# Patient Record
Sex: Female | Born: 1970 | Race: White | Hispanic: Yes | Marital: Married
Health system: Southern US, Community
[De-identification: ages and names within clinical notes are randomized; demographics above are authoritative.]

## PROBLEM LIST (undated history)

## (undated) DIAGNOSIS — Z91018 Allergy to other foods: Secondary | ICD-10-CM

## (undated) DIAGNOSIS — J45909 Unspecified asthma, uncomplicated: Secondary | ICD-10-CM

## (undated) DIAGNOSIS — I519 Heart disease, unspecified: Secondary | ICD-10-CM

---

## 1999-05-12 ENCOUNTER — Ambulatory Visit (HOSPITAL_COMMUNITY): Admission: RE | Admit: 1999-05-12 | Discharge: 1999-05-12 | Payer: Self-pay | Admitting: Cardiology

## 1999-09-20 ENCOUNTER — Other Ambulatory Visit: Admission: RE | Admit: 1999-09-20 | Discharge: 1999-09-20 | Payer: Self-pay | Admitting: Obstetrics and Gynecology

## 2000-12-11 ENCOUNTER — Other Ambulatory Visit: Admission: RE | Admit: 2000-12-11 | Discharge: 2000-12-11 | Payer: Self-pay | Admitting: Obstetrics and Gynecology

## 2001-01-27 ENCOUNTER — Emergency Department (HOSPITAL_COMMUNITY): Admission: EM | Admit: 2001-01-27 | Discharge: 2001-01-27 | Payer: Self-pay

## 2001-01-29 ENCOUNTER — Other Ambulatory Visit: Admission: RE | Admit: 2001-01-29 | Discharge: 2001-01-29 | Payer: Self-pay | Admitting: Orthopedic Surgery

## 2002-02-11 ENCOUNTER — Emergency Department (HOSPITAL_COMMUNITY): Admission: EM | Admit: 2002-02-11 | Discharge: 2002-02-11 | Payer: Self-pay | Admitting: Emergency Medicine

## 2003-04-16 ENCOUNTER — Inpatient Hospital Stay (HOSPITAL_COMMUNITY): Admission: AD | Admit: 2003-04-16 | Discharge: 2003-04-17 | Payer: Self-pay | Admitting: Obstetrics & Gynecology

## 2003-05-13 ENCOUNTER — Other Ambulatory Visit: Admission: RE | Admit: 2003-05-13 | Discharge: 2003-05-13 | Payer: Self-pay | Admitting: Obstetrics and Gynecology

## 2004-03-19 ENCOUNTER — Ambulatory Visit (HOSPITAL_COMMUNITY): Admission: AD | Admit: 2004-03-19 | Discharge: 2004-03-20 | Payer: Self-pay | Admitting: *Deleted

## 2004-09-22 ENCOUNTER — Encounter: Admission: RE | Admit: 2004-09-22 | Discharge: 2004-09-22 | Payer: Self-pay | Admitting: Cardiology

## 2005-01-23 ENCOUNTER — Other Ambulatory Visit: Admission: RE | Admit: 2005-01-23 | Discharge: 2005-01-23 | Payer: Self-pay | Admitting: Obstetrics and Gynecology

## 2005-06-05 ENCOUNTER — Inpatient Hospital Stay (HOSPITAL_COMMUNITY): Admission: AD | Admit: 2005-06-05 | Discharge: 2005-06-08 | Payer: Self-pay | Admitting: Obstetrics and Gynecology

## 2005-06-09 ENCOUNTER — Inpatient Hospital Stay (HOSPITAL_COMMUNITY): Admission: AD | Admit: 2005-06-09 | Discharge: 2005-06-09 | Payer: Self-pay | Admitting: Obstetrics and Gynecology

## 2005-08-04 ENCOUNTER — Inpatient Hospital Stay (HOSPITAL_COMMUNITY): Admission: AD | Admit: 2005-08-04 | Discharge: 2005-08-04 | Payer: Self-pay | Admitting: Obstetrics & Gynecology

## 2005-08-07 ENCOUNTER — Inpatient Hospital Stay (HOSPITAL_COMMUNITY): Admission: RE | Admit: 2005-08-07 | Discharge: 2005-08-10 | Payer: Self-pay | Admitting: Obstetrics and Gynecology

## 2006-11-10 IMAGING — US US OB COMP +14 WK
1 series · 13 of 28 positions shown · non-contrast
Comparison: none

CLINICAL DATA: Maternal atrial fibrillation.  Assess fetal weight, amniotic fluid volume and biophysical profile.  History of cleft lip.

[Series 1: us ob comp +14 wk · 0.47mm/px · 13 of 33 slices shown]
[im 2/33]
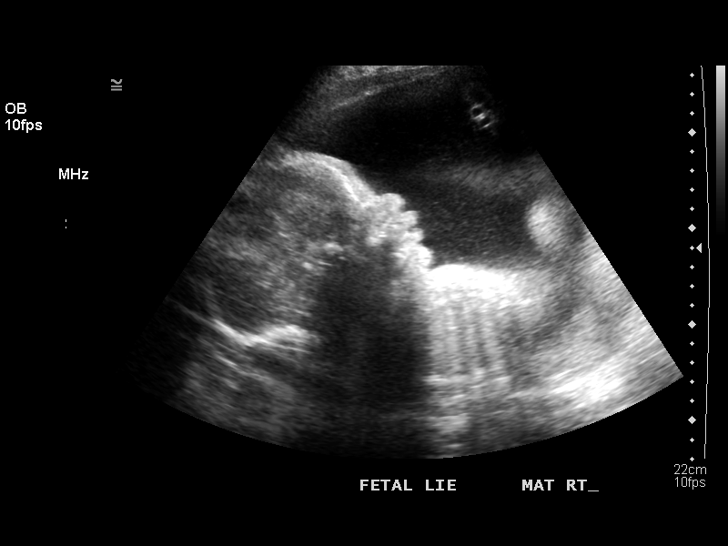
[im 4/33]
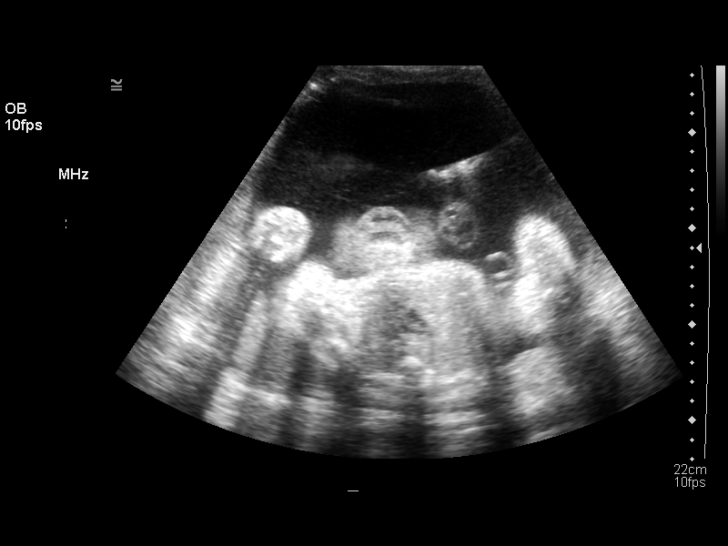
[im 6/33]
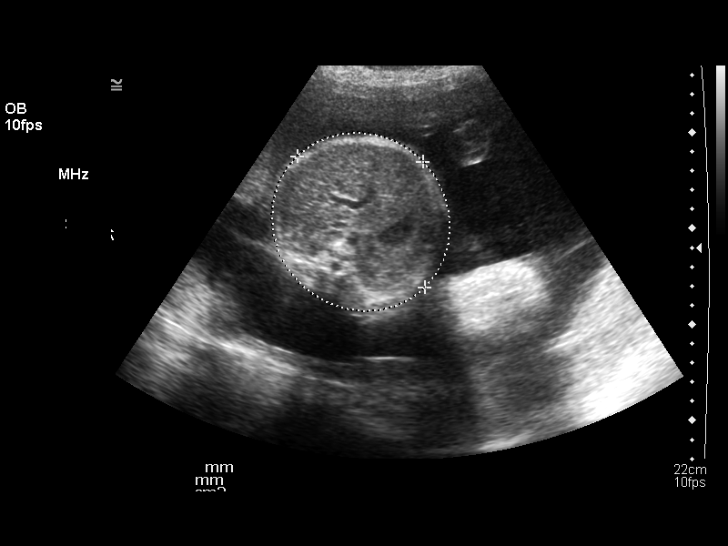
[im 9/33]
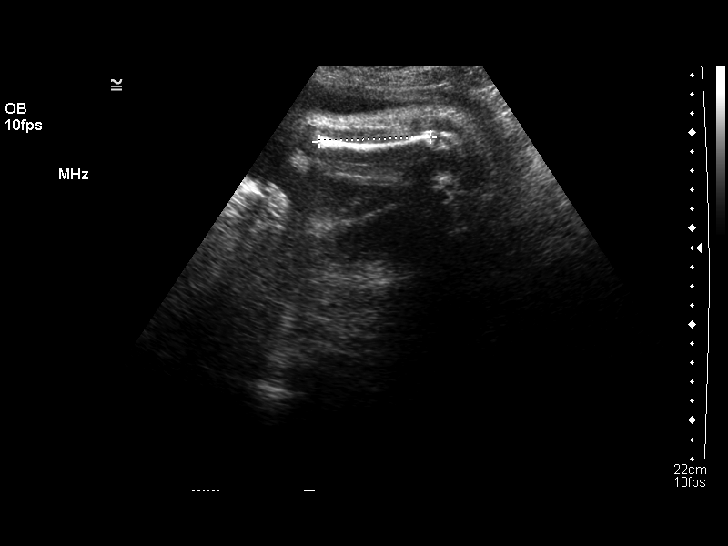
[im 11/33]
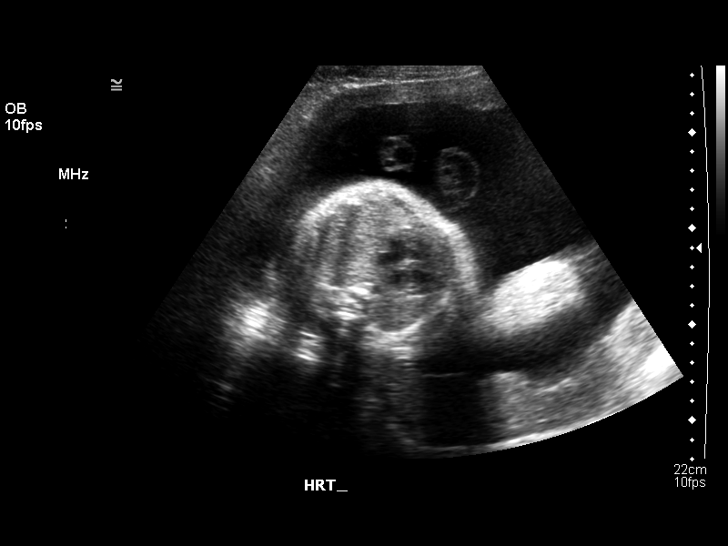
[im 14/33]
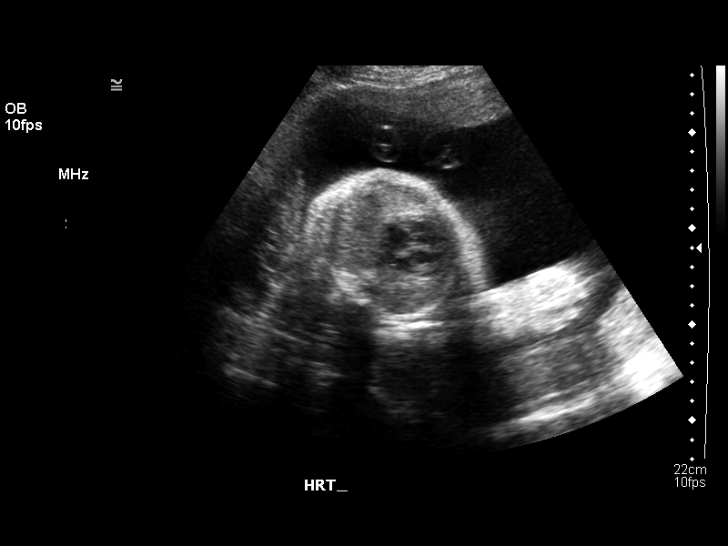
[im 17/33]
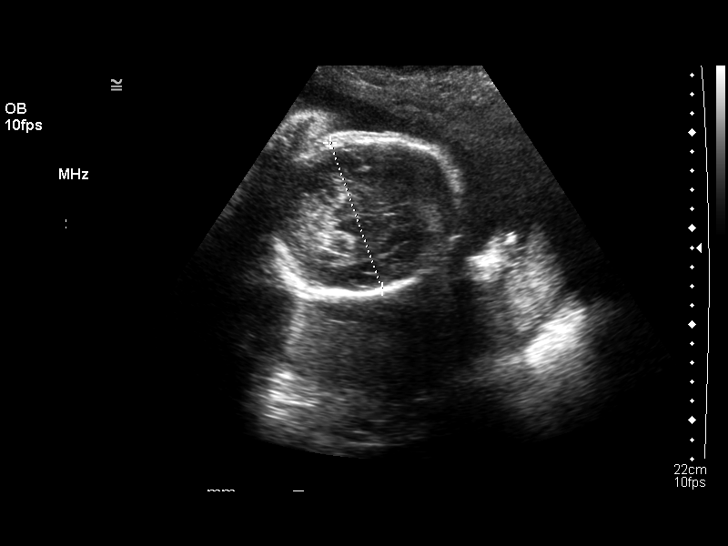
[im 19/33]
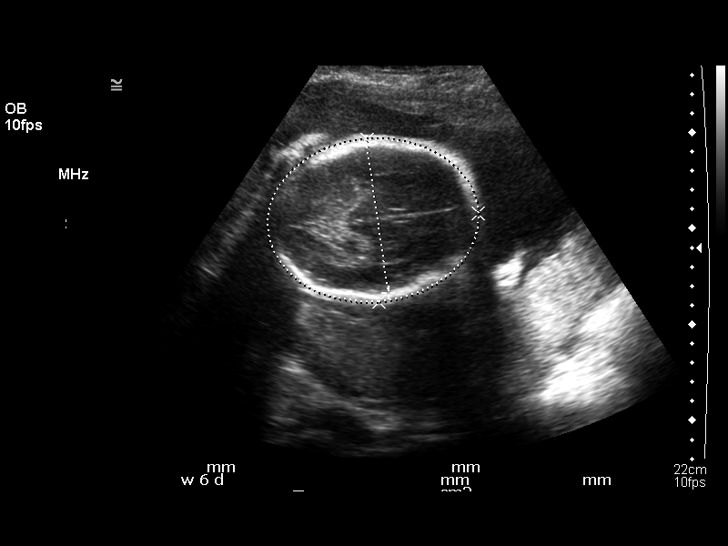
[im 22/33]
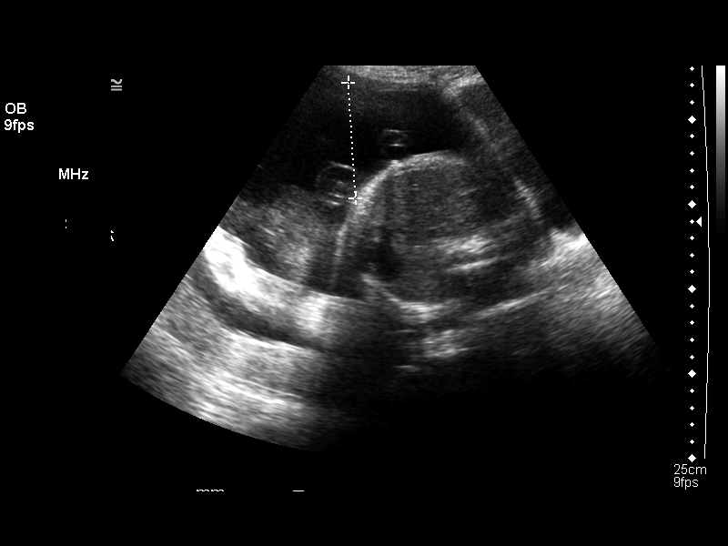
[im 24/33]
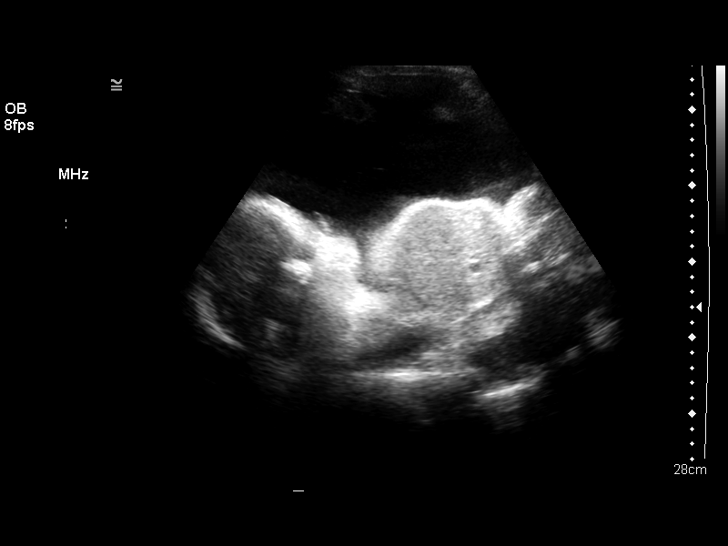
[im 27/33]
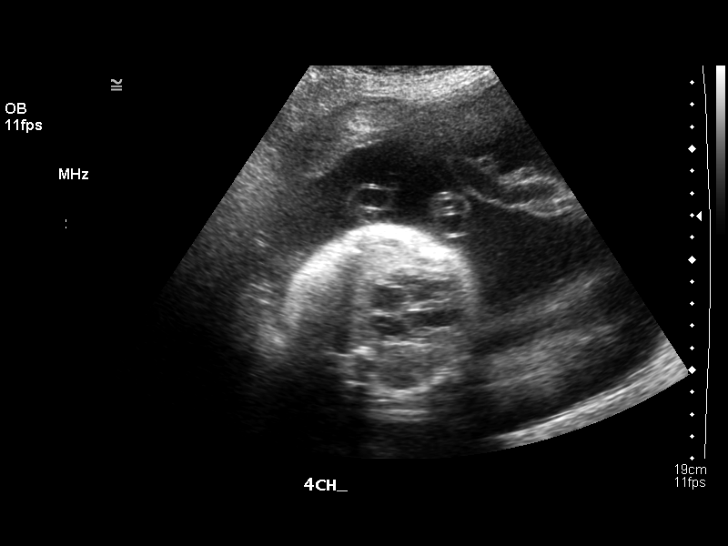
[im 29/33]
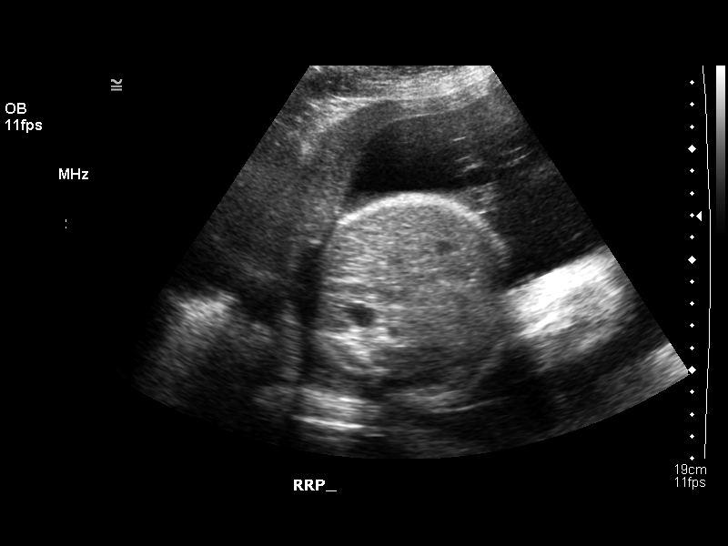
[im 31/33]
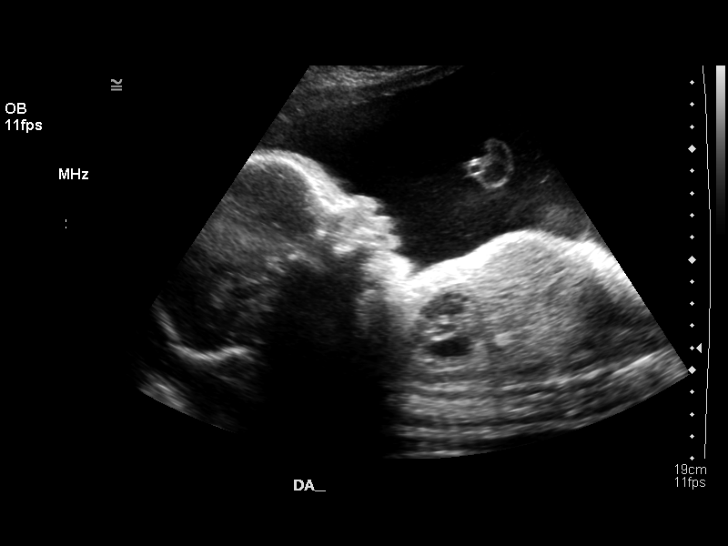

[13 of 28 positions shown; findings below may reference images not displayed]

OBSTETRICAL ULTRASOUND:
Number of Fetuses:  1
Heart Rate:  139
Movement:  Yes
Breathing:  Yes
Presentation:  Transverse
Placental Location:  Posterior
Grade:  I
Previa:  No
Amniotic Fluid (Subjective):  Increased
Amniotic Fluid (Objective):   31.2 cm AFI (5th -95th%ile = 8.8 ? 23.8 cm for 31 wks)

FETAL BIOMETRY
BPD:  8.0 cm   32 w 2 d
HC:  30.2 cm  33 w 4 d
AC:  29.6 cm   33 w 4 d
FL:    5.8 cm  30 w 2 d

MEAN GA:  32 w 3 d
Fetal indices are within normal limits.  
EFW:  6775 g (H) 90th ? 95th%ile (7976 ? 2949 g) For 31 wks

FETAL ANATOMY
Lateral Ventricles:    Visualized 
Thalami/CSP:      Visualized 
Posterior Fossa:  Not visualized 
Nuchal Region:    N/A
Spine:      Not visualized 
4 Chamber Heart on Left:      Visualized 
Stomach on Left:      Visualized 
3 Vessel Cord:    Visualized 
Cord Insertion site:    Visualized 
Kidneys:  Visualized 
Bladder:  Visualized 
Extremities:      Not visualized 

ADDITIONAL ANATOMY VISUALIZED:  Upper lip.
Comments:  Noted is the presence of right renal fetal pyelectasis measuring 7.9 mm in AP width.

MATERNAL UTERINE AND ADNEXAL FINDINGS
Cervix:   4.9 cm transabdominally

BIOPHYSICAL PROFILE

Movement:  2    Time:  20 minutes
Breathing:  2
Tone:  2
Amniotic Fluid:  2

Total Score:  8
IMPRESSION: 1.  Single intrauterine pregnancy demonstrating an estimated gestational age by ultrasound of 32 weeks and 3 days.  This is 1 week and 5 days ahead of assigned gestational age of 30 weeks and 5 days.  Currently the estimated fetal weight is between the 90th and 95th percentile for a 31 week gestation and suggests the presence of a large for gestational age fetus.  
2.  Subjectively and quantitatively increased amniotic fluid volume with an amniotic fluid index of 31.2 cm (8.8 ? 23.8 cm).  
3.  A limited anatomic exam was possible due to advanced gestational age.  No late developing fetal anatomic abnormalities are identified associated with the lateral ventricles, four chamber heart, stomach or bladder.  There is mild renal fetal pyelectasis identified on the right with an AP measurement today of 7.9 mm.  Continued follow-up is recommended.  If this persists past 33 weeks to an extent greater than 7 mm, Ebsta follow-up would be recommended.  etal lip appeared within normal limits.  
4.  Biophysical profile score [DATE].  
5.  Normal cervical length.

## 2014-08-22 ENCOUNTER — Emergency Department (INDEPENDENT_AMBULATORY_CARE_PROVIDER_SITE_OTHER): Payer: BC Managed Care – PPO

## 2014-08-22 ENCOUNTER — Emergency Department
Admission: EM | Admit: 2014-08-22 | Discharge: 2014-08-22 | Disposition: A | Discharge status: Home / Self Care | Payer: Self-pay | Source: Home / Self Care | Attending: Family Medicine | Admitting: Family Medicine

## 2014-08-22 DIAGNOSIS — J181 Lobar pneumonia, unspecified organism: Principal | ICD-10-CM

## 2014-08-22 DIAGNOSIS — J45901 Unspecified asthma with (acute) exacerbation: Secondary | ICD-10-CM

## 2014-08-22 DIAGNOSIS — J189 Pneumonia, unspecified organism: Secondary | ICD-10-CM

## 2014-08-22 DIAGNOSIS — R059 Cough, unspecified: Secondary | ICD-10-CM

## 2014-08-22 DIAGNOSIS — J180 Bronchopneumonia, unspecified organism: Secondary | ICD-10-CM

## 2014-08-22 DIAGNOSIS — R05 Cough: Secondary | ICD-10-CM

## 2014-08-22 HISTORY — DX: Allergy to other foods: Z91.018

## 2014-08-22 HISTORY — DX: Heart disease, unspecified: I51.9

## 2014-08-22 HISTORY — DX: Unspecified asthma, uncomplicated: J45.909

## 2014-08-22 LAB — POCT CBC W AUTO DIFF (K'VILLE URGENT CARE)

## 2014-08-22 MED ORDER — DOXYCYCLINE HYCLATE 100 MG PO CAPS: 100.0000 mg | ORAL_CAPSULE | Freq: Two times a day (BID) | ORAL | Status: AC

## 2014-08-22 MED ORDER — BENZONATATE 200 MG PO CAPS: 200.0000 mg | ORAL_CAPSULE | Freq: Every day | ORAL | Status: AC

## 2014-08-22 NOTE — ED Provider Notes (Signed)
CSN: [id number]     Arrival date & time 08/22/14  1151 History   First MD Initiated Contact with Patient 08/22/14 1439     Chief Complaint  Patient presents with  . Cough      HPI Comments: Patient reports that she was treated for pneumonia six weeks ago with Levaquin 500mg  daily for 10 days, prednisone, and Cherratussin at bedtime.  Three weeks ago her cough increased, with increased sinus congestion.  Six days ago she developed a sore throat.  Her cough remains non-productive and she has developed pleuritic pain in her left upper chest.  No fevers, chills, and sweats.  She was recently prescribed Advair 100/50  The history is provided by the patient.    Past Medical History  Diagnosis Date  . Asthma   . Heart disease     Valve deformity, Irregular heartbeat  . Food allergy     MSG   No past surgical history on file. No family history on file. History  Substance Use Topics  . Smoking status: Not on file  . Smokeless tobacco: Not on file  . Alcohol Use: Not on file   OB History    No data available     Review of Systems + sore throat + cough + pleuritic pain + wheezing + nasal congestion + post-nasal drainage No sinus pain/pressure No itchy/red eyes No earache No hemoptysis + SOB No fever/chills No nausea No vomiting No abdominal pain No diarrhea No urinary symptoms No skin rash + fatigue No myalgias No headache Used OTC meds without relief  Allergies  Toprol xl; Dilaudid; Morphine and related; Phenergan; and Toradol  Home Medications   Prior to Admission medications   Medication Sig Start Date End Date Taking? Authorizing Provider  Fluticasone-Salmeterol (ADVAIR) 250-50 MCG/DOSE AEPB Inhale 1 puff into the lungs 2 (two) times daily.   Yes Historical Provider, MD  benzonatate (TESSALON) 200 MG capsule Take 1 capsule (200 mg total) by mouth at bedtime. Take as needed for cough 08/22/14   Lattie HawStephen A Parissa Chiao, MD  doxycycline (VIBRAMYCIN) 100 MG capsule Take 1  capsule (100 mg total) by mouth 2 (two) times daily. Take with food. 08/22/14   Lattie HawStephen A Shalandria Elsbernd, MD   BP 121/80 mmHg  Pulse 77  Temp(Src) 98 F (36.7 C) (Oral)  Ht 5\' 1"  (1.549 m)  Wt 204 lb (92.534 kg)  BMI 38.57 kg/m2  SpO2 100%  LMP 07/23/2014 Physical Exam Nursing notes and Vital Signs reviewed. Appearance:  Patient appears stated age, and in no acute distress.  Patient is obese (BMI 38.6) Eyes:  Pupils are equal, round, and reactive to light and accomodation.  Extraocular movement is intact.  Conjunctivae are not inflamed  Ears:  Canals normal.  Tympanic membranes normal.  Nose:  Congested turbinates.  No sinus tenderness.    Pharynx:  Normal Neck:  Supple.  Tender enlarged posterior nodes are palpated bilaterally  Lungs:  Clear to auscultation.  Breath sounds are equal.  Heart:  Regular rate and rhythm without murmurs, rubs, or gallops.  Abdomen:  Nontender without masses or hepatosplenomegaly.  Bowel sounds are present.  No CVA or flank tenderness.  Extremities:  No edema.  No calf tenderness Skin:  No rash present.   ED Course  Procedures  None    Labs Reviewed  POCT CBC W AUTO DIFF ([facility]) - Abnormal; Notable for the following:  WBC 8.1; LY 42.3; MO 5.2; GR 52.5; Hgb 13.0; Platelets 207  Imaging Review Dg Chest 2 View  08/22/2014   CLINICAL DATA:  Acute cough, history of asthma, recent pneumonia  EXAM: CHEST - 2 VIEW  COMPARISON:  09/22/2004  FINDINGS: Minimal streaking left lower lung bronchovascular opacity concerning for mild pneumonia. Mild central bronchitic change as before. Right lung remains clear. No effusion, edema or pneumothorax. Trachea midline. No osseous abnormality.  IMPRESSION: Mild streaky left lower lung airspace process/bronchopneumonia.   Electronically Signed   By: Ruel Favorsrevor  Shick M.D.   On: 08/22/2014 14:23     MDM   1. Left lower lobe pneumonia (previous pneumonia probably improving)  2. Cough; suspect new onset viral URI   3.  Asthma with acute exacerbation, unspecified asthma severity    Begin doxycycline.  Prescription written for Benzonatate [facility](Tessalon) to take at bedtime for night-time cough.  Take plain Mucinex (1200 mg guaifenesin) twice daily for cough and congestion.  May add Sudafed for sinus congestion.   Increase fluid intake, rest. May use Afrin nasal spray (or generic oxymetazoline) twice daily for about 5 days.  Also recommend using saline nasal spray several times daily and saline nasal irrigation (AYR is a common brand) Try warm salt water gargles for sore throat.  Stop all antihistamines for now, and other non-prescription cough/cold preparations. Continue Advair.  Continue albuterol inhaler as needed.    Follow-up with family doctor if not improving 7 to 10 days.   Lattie HawStephen A Emina Ribaudo, MD 08/25/14 [phone]

## 2014-08-22 NOTE — Discharge Instructions (Signed)
Take plain Mucinex (1200 mg guaifenesin) twice daily for cough and congestion.  May add Sudafed for sinus congestion.   Increase fluid intake, rest. May use Afrin nasal spray (or generic oxymetazoline) twice daily for about 5 days.  Also recommend using saline nasal spray several times daily and saline nasal irrigation (AYR is a common brand) Try warm salt water gargles for sore throat.  Stop all antihistamines for now, and other non-prescription cough/cold preparations. Continue Advair.  Continue albuterol inhaler as needed.    Follow-up with family doctor if not improving 7 to 10 days.

## 2014-08-22 NOTE — ED Notes (Signed)
Patients states that she was diagnosed a month ago at PCP with pneumonia, she is still coughing, she has not had a repeat chest xray as of today's date, c/o that she has rib pain from coughing

## 2014-08-27 ENCOUNTER — Telehealth: Payer: Self-pay | Admitting: Emergency Medicine

## 2014-08-27 NOTE — ED Notes (Signed)
Inquired about patient's status; encourage them to call with questions/concerns.  

## 2016-01-25 IMAGING — CR DG CHEST 2V
2 series · 2 of 2 positions shown · non-contrast
Comparison: 09/22/2004

CLINICAL DATA: Acute cough, history of asthma, recent pneumonia

EXAM:
CHEST - 2 VIEW

[view not recorded (1 of 2)]
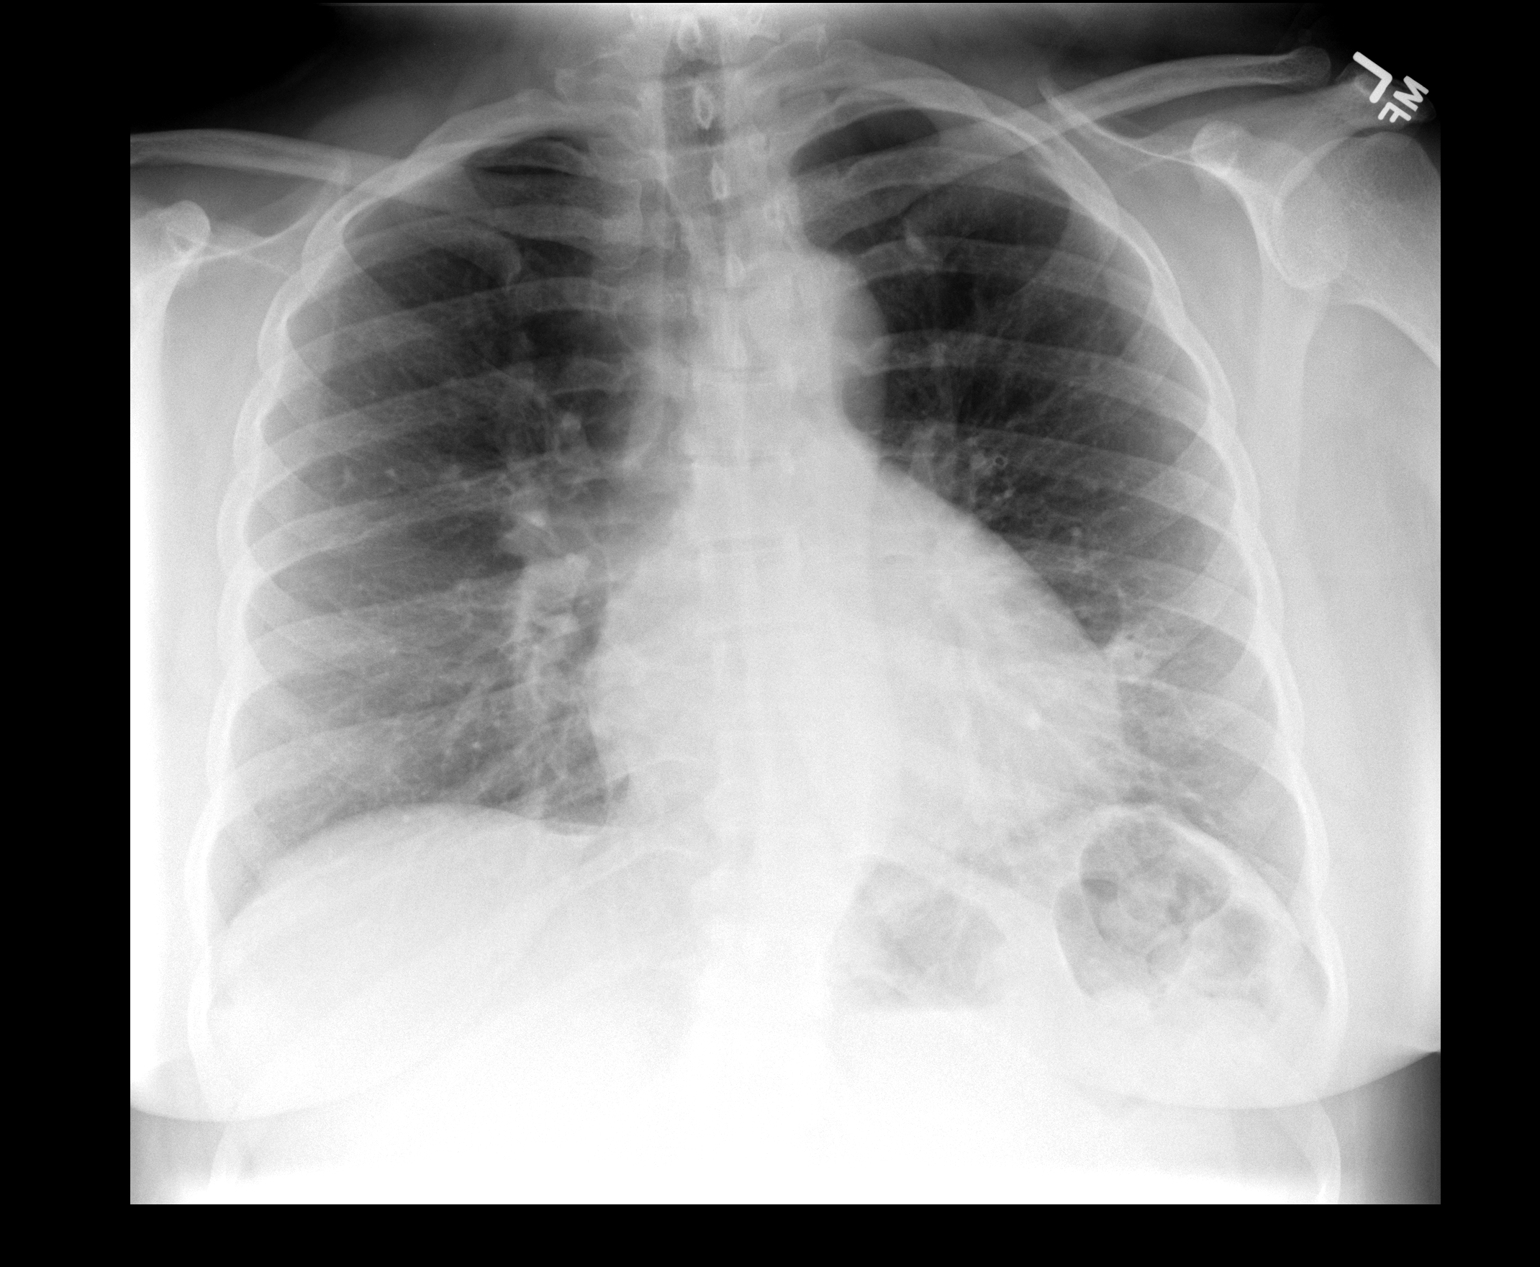

[view not recorded (2 of 2)]
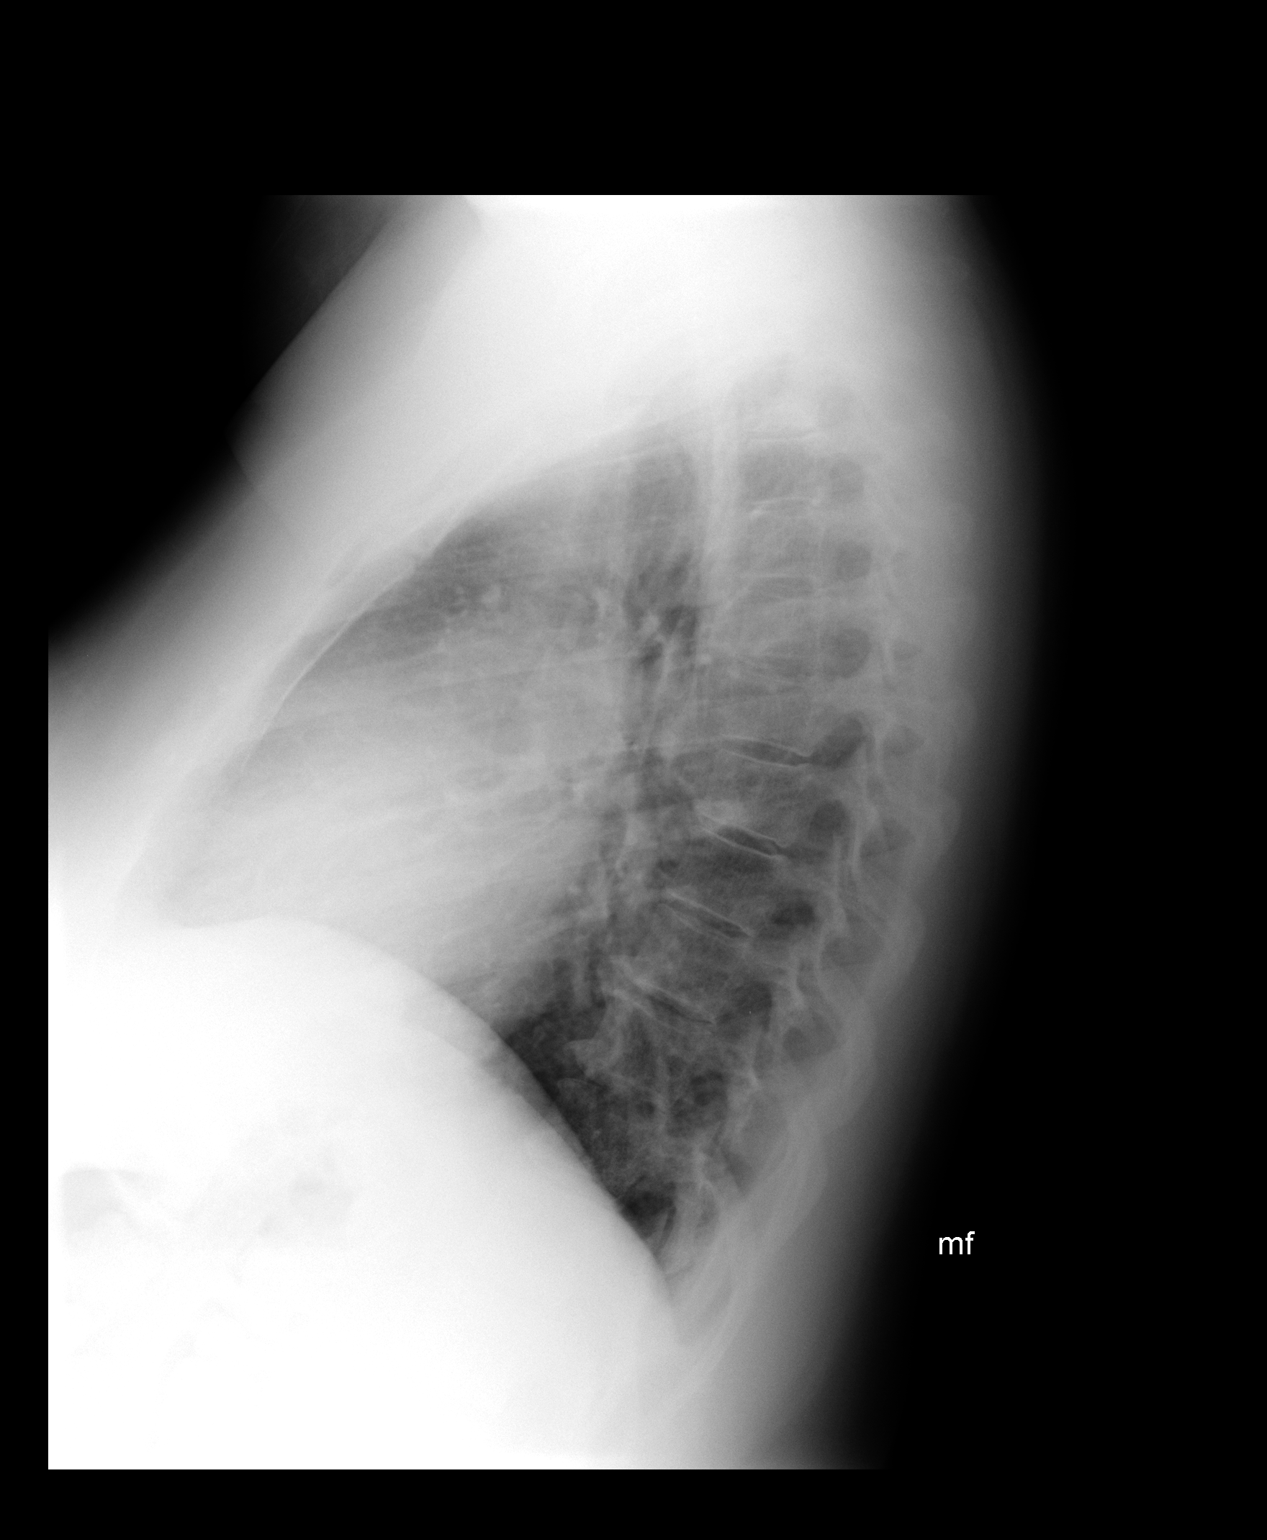

[2 of 2 positions shown; findings below may reference images not displayed]

FINDINGS: Minimal streaking left lower lung bronchovascular opacity concerning
for mild pneumonia. Mild central bronchitic change as before. Right
lung remains clear. No effusion, edema or pneumothorax. Trachea
midline. No osseous abnormality.
IMPRESSION: Mild streaky left lower lung airspace process/bronchopneumonia.

## 2017-07-01 ENCOUNTER — Encounter (INDEPENDENT_AMBULATORY_CARE_PROVIDER_SITE_OTHER): Payer: Self-pay

## 2017-07-11 ENCOUNTER — Encounter (INDEPENDENT_AMBULATORY_CARE_PROVIDER_SITE_OTHER): Payer: Self-pay | Admitting: *Deleted

## 2019-12-18 ENCOUNTER — Ambulatory Visit: Payer: Self-pay | Attending: Internal Medicine

## 2019-12-18 DIAGNOSIS — Z23 Encounter for immunization: Secondary | ICD-10-CM

## 2019-12-18 NOTE — Progress Notes (Signed)
   Covid-[facility] Vaccination [facility]  Name:  DALLIS DARDEN    MRN: [id number] DOB: [birth year]  12/18/2019  Ms. Lemus was observed post Covid-19 immunization for 30 minutes based on pre-vaccination screening without incident. She was provided with Vaccine Information Sheet and instruction to access the V-Safe system.   Ms. Lipuma was instructed to call 911 with any severe reactions post vaccine: Marland Kitchen Difficulty breathing  . Swelling of face and throat  . A fast heartbeat  . A bad rash all over body  . Dizziness and weakness   Immunizations Administered    Name Date Dose VIS Date Route   Pfizer COVID-19 Vaccine 12/18/2019  8:52 AM 0.3 mL 08/28/2019 Intramuscular   Manufacturer: [employer], [employer]   Lot: [id number]   NDC: [address]-8

## 2020-01-12 ENCOUNTER — Ambulatory Visit: Payer: Self-pay

## 2020-01-19 ENCOUNTER — Ambulatory Visit: Payer: Self-pay

## 2020-02-08 ENCOUNTER — Ambulatory Visit: Payer: Self-pay
